# Patient Record
Sex: Male | Born: 1995 | Race: White | Hispanic: No | Marital: Single | Smoking: Never smoker
Health system: Southern US, Community
[De-identification: ages and names within clinical notes are randomized; demographics above are authoritative.]

## PROBLEM LIST (undated history)

## (undated) HISTORY — PX: CLAVICLE SURGERY: SHX598

## (undated) HISTORY — PX: ELBOW SURGERY: SHX618

---
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
## 2021-02-01 ENCOUNTER — Emergency Department (HOSPITAL_BASED_OUTPATIENT_CLINIC_OR_DEPARTMENT_OTHER): Payer: PRIVATE HEALTH INSURANCE

## 2021-02-01 ENCOUNTER — Emergency Department (HOSPITAL_BASED_OUTPATIENT_CLINIC_OR_DEPARTMENT_OTHER)
Admission: EM | Admit: 2021-02-01 | Discharge: 2021-02-01 | Disposition: A | Discharge status: Home / Self Care | Payer: PRIVATE HEALTH INSURANCE | Attending: Emergency Medicine | Admitting: Emergency Medicine

## 2021-02-01 ENCOUNTER — Other Ambulatory Visit: Payer: Self-pay

## 2021-02-01 ENCOUNTER — Encounter (HOSPITAL_BASED_OUTPATIENT_CLINIC_OR_DEPARTMENT_OTHER): Payer: Self-pay | Admitting: Emergency Medicine

## 2021-02-01 DIAGNOSIS — Q7190 Unspecified reduction defect of unspecified upper limb: Secondary | ICD-10-CM

## 2021-02-01 DIAGNOSIS — S53105A Unspecified dislocation of left ulnohumeral joint, initial encounter: Secondary | ICD-10-CM | POA: Insufficient documentation

## 2021-02-01 DIAGNOSIS — Y9289 Other specified places as the place of occurrence of the external cause: Secondary | ICD-10-CM | POA: Diagnosis not present

## 2021-02-01 DIAGNOSIS — S59902A Unspecified injury of left elbow, initial encounter: Secondary | ICD-10-CM | POA: Diagnosis present

## 2021-02-01 DIAGNOSIS — Y9389 Activity, other specified: Secondary | ICD-10-CM | POA: Insufficient documentation

## 2021-02-01 DIAGNOSIS — F17220 Nicotine dependence, chewing tobacco, uncomplicated: Secondary | ICD-10-CM | POA: Diagnosis not present

## 2021-02-01 DIAGNOSIS — W228XXA Striking against or struck by other objects, initial encounter: Secondary | ICD-10-CM | POA: Insufficient documentation

## 2021-02-01 MED ORDER — KETOROLAC TROMETHAMINE 30 MG/ML IJ SOLN
30.0000 mg | Freq: Once | INTRAMUSCULAR | Status: AC
  Administered 2021-02-01: 30 mg via INTRAVENOUS
  Filled 2021-02-01: qty 1

## 2021-02-01 MED ORDER — DICLOFENAC SODIUM ER 100 MG PO TB24: 100.0000 mg | ORAL_TABLET | Freq: Every day | ORAL | 0 refills | Status: AC

## 2021-02-01 MED ORDER — FENTANYL CITRATE PF 50 MCG/ML IJ SOSY
100.0000 ug | PREFILLED_SYRINGE | Freq: Once | INTRAMUSCULAR | Status: AC
  Administered 2021-02-01: 100 ug via INTRAVENOUS
  Filled 2021-02-01: qty 2
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
## 2021-02-01 NOTE — ED Notes (Signed)
Pt attempted to sign MSE waiver. Signature pad not working.

## 2021-02-01 NOTE — ED Triage Notes (Signed)
Pt states he dislocated his L elbow punching a headboard ~45 min ago. 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 +deformity. Reports ETOH use ~6 beers today.

## 2022-03-24 IMAGING — DX DG ELBOW 2V*L*
2 series · 2 of 2 positions shown · non-contrast
Comparison: None.

CLINICAL DATA: Left elbow dislocation, post reduction imaging

EXAM:
LEFT ELBOW - 2 VIEW

[elbow ap]
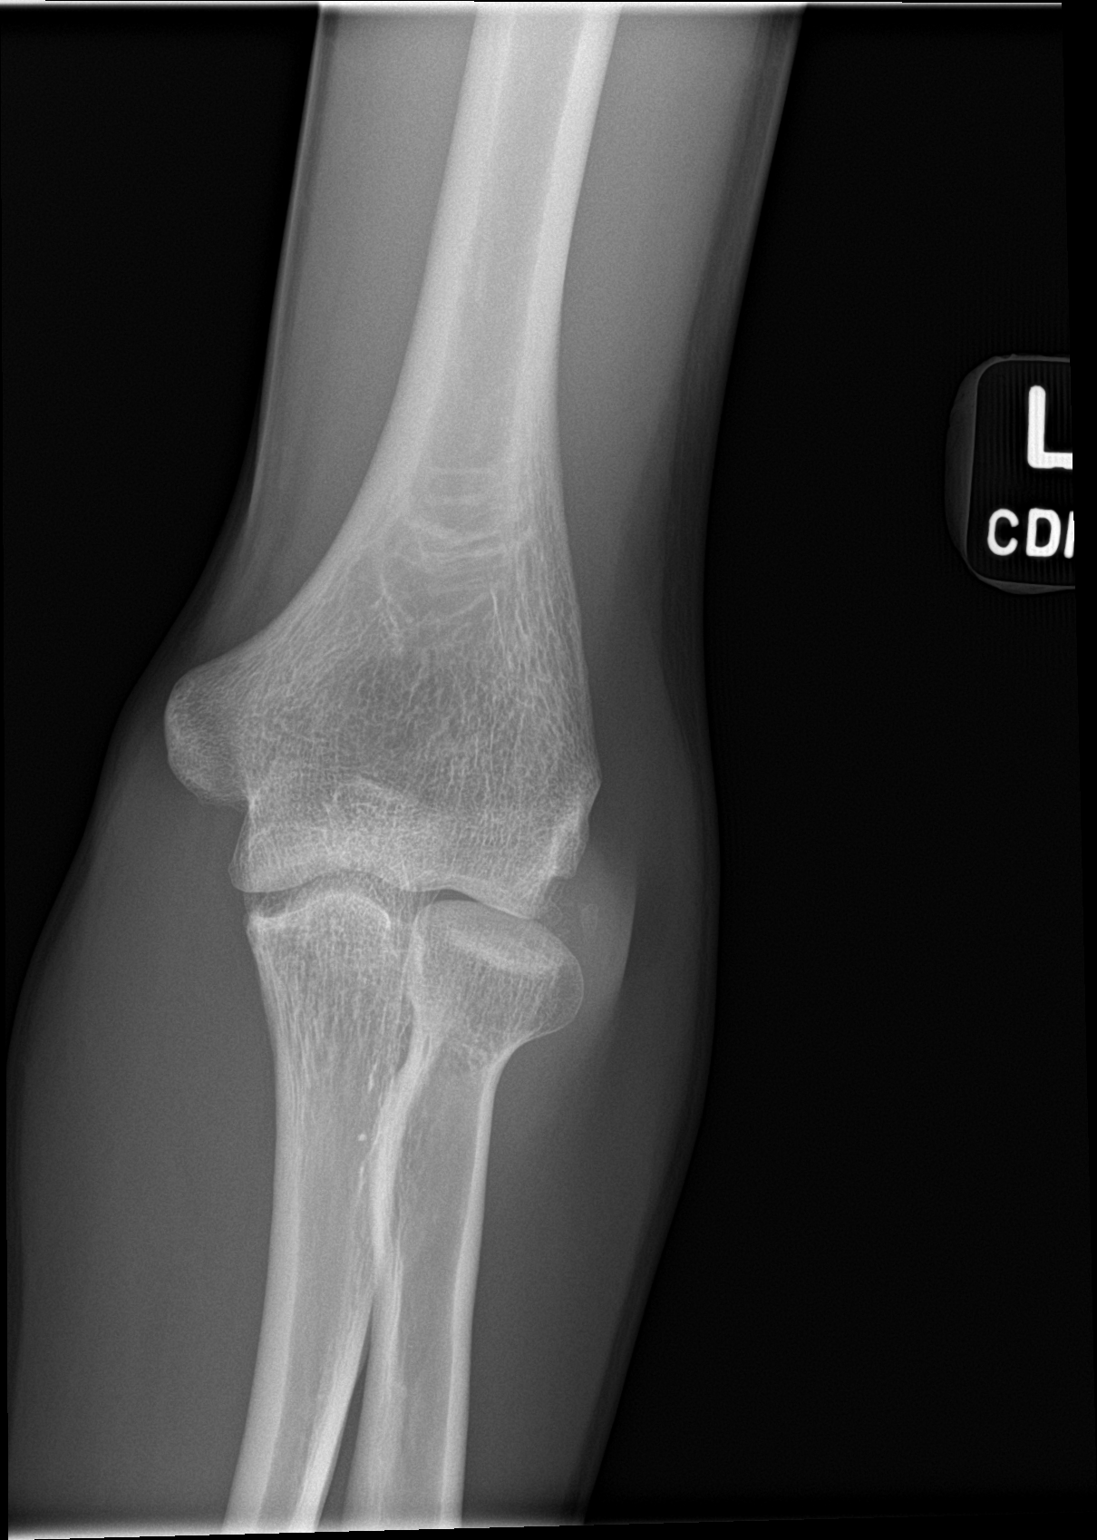

[elbow lat]
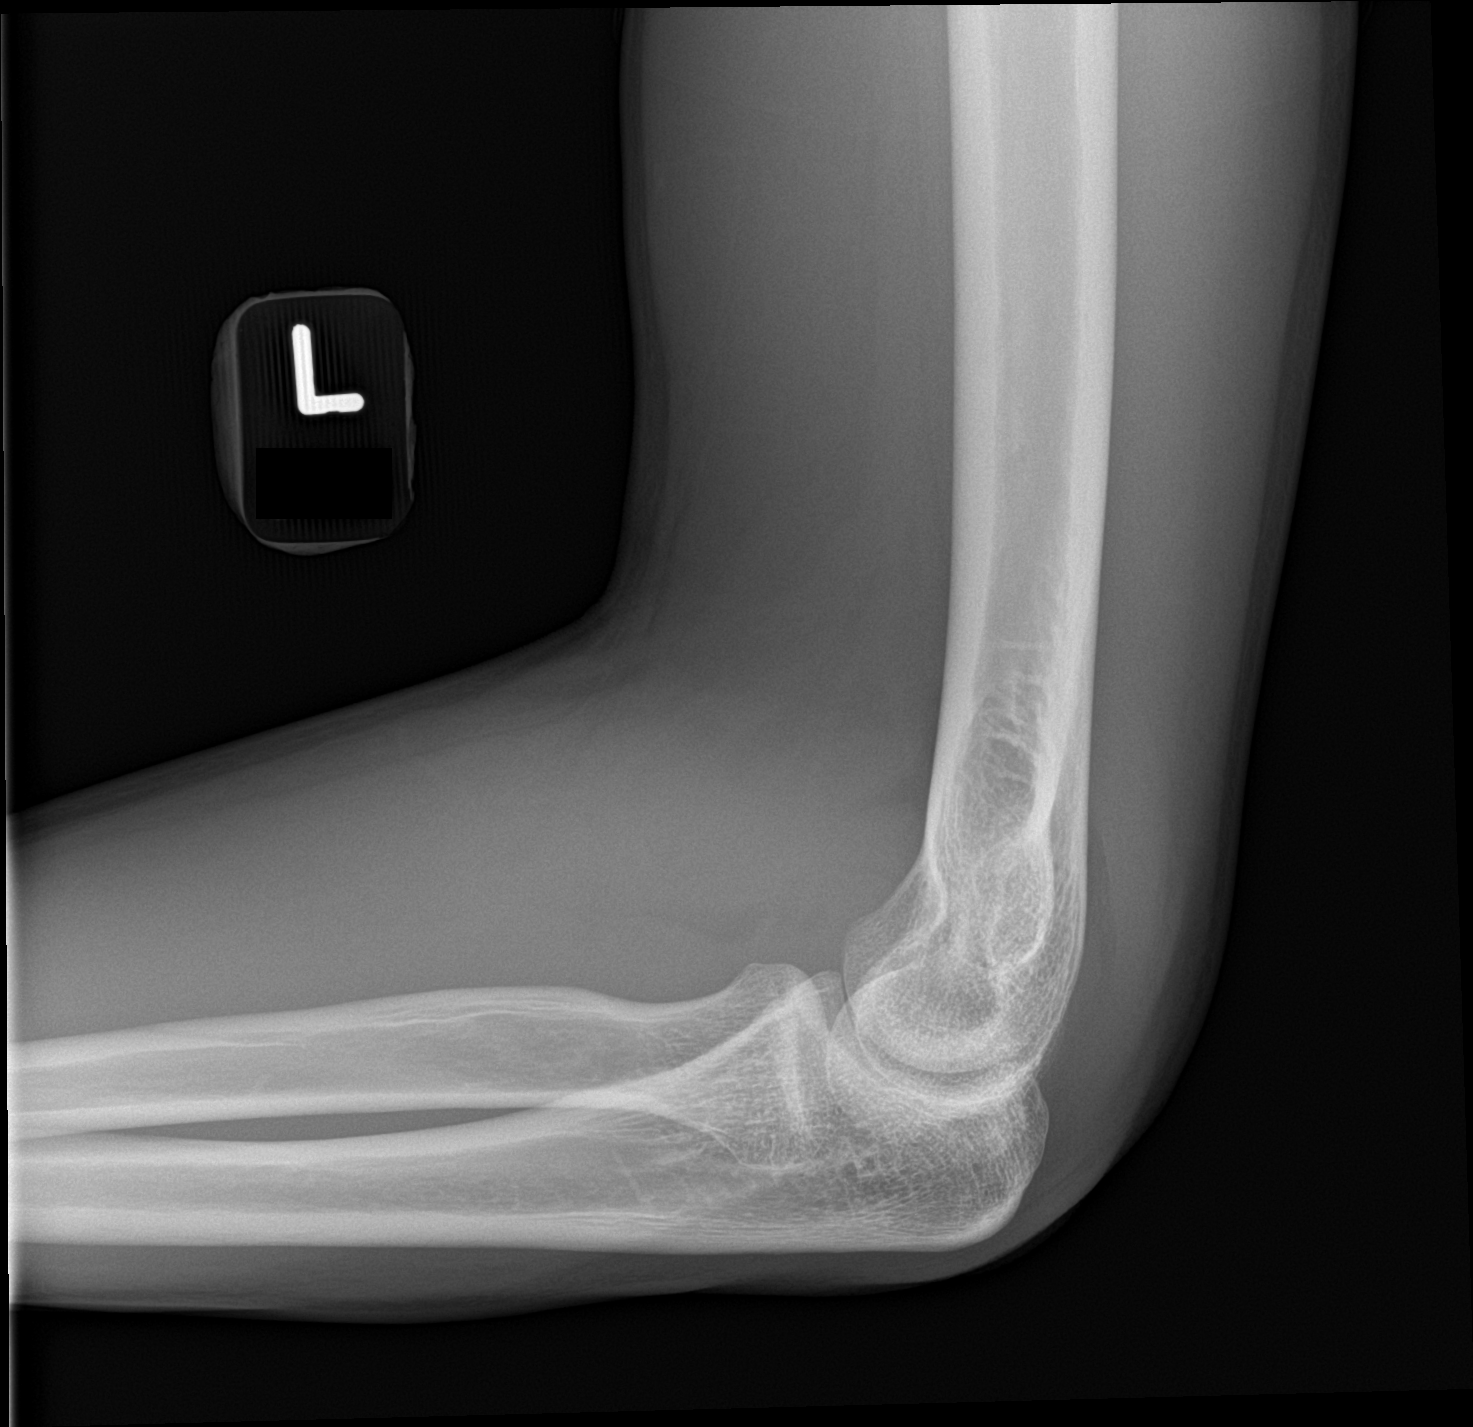

[2 of 2 positions shown; findings below may reference images not displayed]

FINDINGS: Two view radiograph left elbow demonstrates interval reduction of
left elbow dislocation with now normal alignment of the left elbow.
There are several small corticated ossific densities adjacent to the
radiocarpal articulation of unclear significance. This may represent
the sequela of remote trauma or inflammation given its corticated
appearance. No definite acute fracture identified. Large left elbow
effusion present.
IMPRESSION: Interval reduction of left elbow.  Normal alignment.
